# Patient Record
Sex: Male | Born: 1970 | Race: Black or African American | Hispanic: No | Marital: Single | Smoking: Current every day smoker
Health system: Southern US, Community
[De-identification: ages and names within clinical notes are randomized; demographics above are authoritative.]

## PROBLEM LIST (undated history)

## (undated) HISTORY — PX: VASECTOMY: SHX75

---

## 2010-08-01 ENCOUNTER — Emergency Department: Payer: Self-pay | Admitting: Emergency Medicine

## 2010-10-12 DEATH — deceased

## 2011-01-24 ENCOUNTER — Emergency Department: Payer: Self-pay | Admitting: Emergency Medicine

## 2011-06-23 ENCOUNTER — Emergency Department: Payer: Self-pay | Admitting: Emergency Medicine

## 2014-11-23 ENCOUNTER — Emergency Department: Payer: Self-pay | Admitting: Emergency Medicine

## 2018-07-01 ENCOUNTER — Encounter: Payer: Self-pay | Admitting: Emergency Medicine

## 2018-07-01 ENCOUNTER — Emergency Department
Admission: EM | Admit: 2018-07-01 | Discharge: 2018-07-01 | Disposition: A | Discharge status: Home / Self Care | Payer: Self-pay | Attending: Emergency Medicine | Admitting: Emergency Medicine

## 2018-07-01 ENCOUNTER — Other Ambulatory Visit: Payer: Self-pay

## 2018-07-01 DIAGNOSIS — R1084 Generalized abdominal pain: Secondary | ICD-10-CM | POA: Insufficient documentation

## 2018-07-01 DIAGNOSIS — F17210 Nicotine dependence, cigarettes, uncomplicated: Secondary | ICD-10-CM | POA: Insufficient documentation

## 2018-07-01 LAB — CBC WITH DIFFERENTIAL/PLATELET
BASOS ABS: 0.1 10*3/uL (ref 0–0.1)
Basophils Relative: 1 %
EOS PCT: 3 %
Eosinophils Absolute: 0.2 10*3/uL (ref 0–0.7)
HCT: 41.9 % (ref 40.0–52.0)
Hemoglobin: 14.4 g/dL (ref 13.0–18.0)
Lymphocytes Relative: 31 %
Lymphs Abs: 2.2 10*3/uL (ref 1.0–3.6)
MCH: 32.3 pg (ref 26.0–34.0)
MCHC: 34.5 g/dL (ref 32.0–36.0)
MCV: 93.5 fL (ref 80.0–100.0)
MONO ABS: 0.7 10*3/uL (ref 0.2–1.0)
MONOS PCT: 9 %
Neutro Abs: 3.9 10*3/uL (ref 1.4–6.5)
Neutrophils Relative %: 56 %
PLATELETS: 325 10*3/uL (ref 150–440)
RBC: 4.48 MIL/uL (ref 4.40–5.90)
RDW: 12.9 % (ref 11.5–14.5)
WBC: 7.1 10*3/uL (ref 3.8–10.6)

## 2018-07-01 LAB — URINALYSIS, COMPLETE (UACMP) WITH MICROSCOPIC
Bacteria, UA: NONE SEEN
Bilirubin Urine: NEGATIVE
Glucose, UA: NEGATIVE mg/dL
Hgb urine dipstick: NEGATIVE
KETONES UR: 5 mg/dL — AB
Leukocytes, UA: NEGATIVE
Nitrite: NEGATIVE
PH: 5 (ref 5.0–8.0)
Protein, ur: NEGATIVE mg/dL
Specific Gravity, Urine: 1.021 (ref 1.005–1.030)
Squamous Epithelial / LPF: NONE SEEN (ref 0–5)

## 2018-07-01 LAB — COMPREHENSIVE METABOLIC PANEL
ALT: 12 U/L (ref 0–44)
AST: 21 U/L (ref 15–41)
Albumin: 4.1 g/dL (ref 3.5–5.0)
Alkaline Phosphatase: 74 U/L (ref 38–126)
Anion gap: 8 (ref 5–15)
BUN: 11 mg/dL (ref 6–20)
CHLORIDE: 105 mmol/L (ref 98–111)
CO2: 28 mmol/L (ref 22–32)
Calcium: 9.2 mg/dL (ref 8.9–10.3)
Creatinine, Ser: 0.98 mg/dL (ref 0.61–1.24)
GFR calc Af Amer: >60 mL/min (ref >60)
Glucose, Bld: 68 mg/dL — ABNORMAL LOW (ref 70–99)
Potassium: 3.7 mmol/L (ref 3.5–5.1)
Sodium: 141 mmol/L (ref 135–145)
Total Bilirubin: 0.4 mg/dL (ref 0.3–1.2)
Total Protein: 7.8 g/dL (ref 6.5–8.1)

## 2018-07-01 LAB — LIPASE, BLOOD: LIPASE: 50 U/L (ref 11–51)

## 2018-07-01 MED ORDER — MORPHINE SULFATE (PF) 4 MG/ML IV SOLN
INTRAVENOUS | Status: AC
  Administered 2018-07-01: 4 mg via INTRAVENOUS
  Filled 2018-07-01: qty 1

## 2018-07-01 MED ORDER — DICYCLOMINE HCL 20 MG PO TABS: 20.0000 mg | ORAL_TABLET | Freq: Three times a day (TID) | ORAL | 0 refills | Status: DC | PRN

## 2018-07-01 MED ORDER — ONDANSETRON HCL 4 MG/2ML IJ SOLN
4.0000 mg | Freq: Once | INTRAMUSCULAR | Status: AC
  Administered 2018-07-01: 4 mg via INTRAVENOUS

## 2018-07-01 MED ORDER — MORPHINE SULFATE (PF) 4 MG/ML IV SOLN
4.0000 mg | Freq: Once | INTRAVENOUS | Status: AC
  Administered 2018-07-01: 4 mg via INTRAVENOUS

## 2018-07-01 MED ORDER — DICYCLOMINE HCL 10 MG/ML IM SOLN
20.0000 mg | Freq: Once | INTRAMUSCULAR | Status: AC
  Administered 2018-07-01: 20 mg via INTRAMUSCULAR
  Filled 2018-07-01: qty 2

## 2018-07-01 MED ORDER — ONDANSETRON HCL 4 MG/2ML IJ SOLN
INTRAMUSCULAR | Status: AC
  Administered 2018-07-01: 4 mg via INTRAVENOUS
  Filled 2018-07-01: qty 2

## 2018-07-01 MED ORDER — ONDANSETRON HCL 4 MG PO TABS: 4.0000 mg | ORAL_TABLET | Freq: Three times a day (TID) | ORAL | 0 refills | Status: DC | PRN

## 2018-07-01 NOTE — ED Notes (Signed)

## 2018-07-01 NOTE — ED Triage Notes (Signed)
Patient ambulatory to triage with steady gait, without difficulty, appears uncomfortable; st since 4pm yesterday having lower abd pain with no accomp symptoms; denies hx of same

## 2018-07-01 NOTE — ED Provider Notes (Signed)
[facility] Emergency Department Provider Note   ____________________________________________   I have reviewed the triage vital signs and the nursing notes.   HISTORY  Chief Complaint Abdominal Pain   History limited by: Not Limited   HPI Sean Blackburn is a 47 y.o. male who presents to the emergency department today because of concern for abdominal pain. Pain started yesterday around 1 pm. Has been constant since then. Located across the mid and lower abdomen. He describes it as severe. Has been accompanied by some nausea without vomiting. Denies any diarrhea. States had similar pain once before when he thought he had food poisoning. Denies change in urination.  Per medical record review patient has a history of ct abd in the past.  History reviewed. No pertinent past medical history.  There are no active problems to display for this patient.   History reviewed. No pertinent surgical history.  Prior to Admission medications   Not on File    Allergies Hydrocodone  No family history on file.  Social History Social History   Tobacco Use  . Smoking status: Current Every Day Smoker  . Smokeless tobacco: Never Used  Substance Use Topics  . Alcohol use: Not Currently  . Drug use: Not on file    Review of Systems Constitutional: No fever/chills Eyes: No visual changes. ENT: No sore throat. Cardiovascular: Denies chest pain. Respiratory: Denies shortness of breath. Gastrointestinal: Positive for abdominal pain. Genitourinary: Negative for dysuria. Musculoskeletal: Negative for back pain. Skin: Negative for rash. Neurological: Negative for headaches, focal weakness or numbness.  ____________________________________________   PHYSICAL EXAM:  VITAL SIGNS: ED Triage Vitals  Enc Vitals Group     BP 07/01/18 0703 123/72     Pulse Rate 07/01/18 0703 (!) 55     Resp 07/01/18 0703 18     Temp 07/01/18 0703 97.8 F (36.6 C)     Temp  Source 07/01/18 0703 Oral     SpO2 07/01/18 0703 100 %     Weight 07/01/18 0700 145 lb (65.8 kg)     Height 07/01/18 0700 5\' 11"  (1.803 m)     Head Circumference --      Peak Flow --      Pain Score 07/01/18 0700 10   Constitutional: Alert and oriented.  Eyes: Conjunctivae are normal.  ENT      Head: Normocephalic and atraumatic.      Nose: No congestion/rhinnorhea.      Mouth/Throat: Mucous membranes are moist.      Neck: No stridor. Hematological/Lymphatic/Immunilogical: No cervical lymphadenopathy. Cardiovascular: Normal rate, regular rhythm.  No murmurs, rubs, or gallops.  Respiratory: Normal respiratory effort without tachypnea nor retractions. Breath sounds are clear and equal bilaterally. No wheezes/rales/rhonchi. Gastrointestinal: Soft and non tender. No rebound. No guarding.  Genitourinary: Deferred Musculoskeletal: Normal range of motion in all extremities. No lower extremity edema. Neurologic:  Normal speech and language. No gross focal neurologic deficits are appreciated.  Skin:  Skin is warm, dry and intact. No rash noted. Psychiatric: Mood and affect are normal. Speech and behavior are normal. Patient exhibits appropriate insight and judgment.  ____________________________________________    LABS (pertinent positives/negatives)  Lipase 50 CMP wnl except glu 68 UA ketones 5 otherwise unremarkable CBC wbc 7.1, hgb 14.4, plt 325  ____________________________________________   EKG  None  ____________________________________________    RADIOLOGY  None  ____________________________________________   PROCEDURES  Procedures  ____________________________________________   INITIAL IMPRESSION / ASSESSMENT AND PLAN / ED COURSE  Pertinent  labs & imaging results that were available during my care of the patient were reviewed by me and considered in my medical decision making (see chart for details).   Presented to the emergency department today because  of concerns for abdominal pain.  Differential would be large including urinary tract infection, kidney stone, diverticulitis, appendicitis, gallbladder disease, or enteritis, IBS, gastritis amongst other etiologies.  Patient's work-up without any concerning leukocytosis.  Urine without findings consistent for infection or kidney stone.  Patient did feel better after medications.  At this point I think gastroenteritis likely.  Discussed this with the patient.  Discussed return precautions.   ____________________________________________   FINAL CLINICAL IMPRESSION(S) / ED DIAGNOSES  Final diagnoses:  Generalized abdominal pain     Note: This dictation was prepared with Dragon dictation. Any transcriptional errors that result from this process are unintentional     Phineas Semen, MD 07/01/18 1248

## 2018-07-01 NOTE — Discharge Instructions (Addendum)
Please seek medical attention for any high fevers, chest pain, shortness of breath, change in behavior, persistent vomiting, bloody stool or any other new or concerning symptoms.  

## 2019-04-12 ENCOUNTER — Ambulatory Visit (INDEPENDENT_AMBULATORY_CARE_PROVIDER_SITE_OTHER): Payer: Self-pay

## 2019-04-12 ENCOUNTER — Other Ambulatory Visit: Payer: Self-pay

## 2019-04-12 ENCOUNTER — Ambulatory Visit
Admission: EM | Admit: 2019-04-12 | Discharge: 2019-04-12 | Disposition: A | Discharge status: Home / Self Care | Payer: Self-pay | Attending: Emergency Medicine | Admitting: Emergency Medicine

## 2019-04-12 ENCOUNTER — Encounter: Payer: Self-pay | Admitting: Emergency Medicine

## 2019-04-12 DIAGNOSIS — M25562 Pain in left knee: Secondary | ICD-10-CM

## 2019-04-12 DIAGNOSIS — M545 Low back pain, unspecified: Secondary | ICD-10-CM

## 2019-04-12 DIAGNOSIS — M25561 Pain in right knee: Secondary | ICD-10-CM

## 2019-04-12 DIAGNOSIS — M542 Cervicalgia: Secondary | ICD-10-CM

## 2019-04-12 MED ORDER — CYCLOBENZAPRINE HCL 10 MG PO TABS: 10.0000 mg | ORAL_TABLET | Freq: Two times a day (BID) | ORAL | 0 refills | Status: DC | PRN

## 2019-04-12 MED ORDER — PREDNISONE 10 MG PO TABS: ORAL_TABLET | ORAL | 0 refills | Status: DC

## 2019-04-12 NOTE — Discharge Instructions (Signed)
Take medication as prescribed. Rest. Drink plenty of fluids. Stretch. Avoid strenuous activity.  Follow up with your primary care physician or the above this week as needed. Return to Urgent care for new or worsening concerns.
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
## 2019-08-27 ENCOUNTER — Emergency Department: Payer: Self-pay

## 2019-08-27 ENCOUNTER — Other Ambulatory Visit: Payer: Self-pay

## 2019-08-27 ENCOUNTER — Emergency Department
Admission: EM | Admit: 2019-08-27 | Discharge: 2019-08-27 | Disposition: A | Discharge status: Home / Self Care | Payer: Self-pay | Attending: Emergency Medicine | Admitting: Emergency Medicine

## 2019-08-27 DIAGNOSIS — F17291 Nicotine dependence, other tobacco product, in remission: Secondary | ICD-10-CM | POA: Insufficient documentation

## 2019-08-27 DIAGNOSIS — N50819 Testicular pain, unspecified: Secondary | ICD-10-CM

## 2019-08-27 DIAGNOSIS — N452 Orchitis: Secondary | ICD-10-CM

## 2019-08-27 LAB — URINALYSIS, COMPLETE (UACMP) WITH MICROSCOPIC
Bacteria, UA: NONE SEEN
Bilirubin Urine: NEGATIVE
Glucose, UA: NEGATIVE mg/dL
Hgb urine dipstick: NEGATIVE
Ketones, ur: NEGATIVE mg/dL
Leukocytes,Ua: NEGATIVE
Nitrite: NEGATIVE
Protein, ur: NEGATIVE mg/dL
Specific Gravity, Urine: 1.014 (ref 1.005–1.030)
Squamous Epithelial / LPF: NONE SEEN (ref 0–5)
pH: 7 (ref 5.0–8.0)

## 2019-08-27 MED ORDER — TRAMADOL HCL 50 MG PO TABS: 50.0000 mg | ORAL_TABLET | Freq: Four times a day (QID) | ORAL | 0 refills | Status: DC | PRN

## 2019-08-27 MED ORDER — TRAMADOL HCL 50 MG PO TABS: 50.0000 mg | ORAL_TABLET | Freq: Four times a day (QID) | ORAL | 0 refills | Status: AC | PRN

## 2019-08-27 MED ORDER — SULFAMETHOXAZOLE-TRIMETHOPRIM 800-160 MG PO TABS: 1.0000 | ORAL_TABLET | Freq: Two times a day (BID) | ORAL | 0 refills | Status: AC

## 2019-08-27 MED ORDER — ONDANSETRON 4 MG PO TBDP
4.0000 mg | ORAL_TABLET | Freq: Once | ORAL | Status: AC
  Administered 2019-08-27: 10:00:00 4 mg via ORAL
  Filled 2019-08-27: qty 1

## 2019-08-27 MED ORDER — SULFAMETHOXAZOLE-TRIMETHOPRIM 800-160 MG PO TABS: 1.0000 | ORAL_TABLET | Freq: Two times a day (BID) | ORAL | 0 refills | Status: DC

## 2019-08-27 MED ORDER — MORPHINE SULFATE (PF) 4 MG/ML IV SOLN
4.0000 mg | Freq: Once | INTRAVENOUS | Status: AC
  Administered 2019-08-27: 4 mg via INTRAMUSCULAR
  Filled 2019-08-27: qty 1

## 2019-08-27 NOTE — Discharge Instructions (Addendum)
Follow-up with your regular doctor or urology if not better in 2 to 3 days.  Please call for an appointment.  Take your antibiotic as prescribed.  Return emergency department if you are worsening

## 2019-08-27 NOTE — ED Triage Notes (Signed)
Pt states testicle pain since Friday. States noticed swelling this AM. Denies injury. States dysuria but denies discharge. A&O.

## 2019-08-27 NOTE — ED Notes (Signed)
See triage note  Presents with pain to lower back and pain into his testicles  States pain started on Friday  Denies any trauma ,blood in urine or discharge

## 2019-08-27 NOTE — ED Provider Notes (Signed)
[facility] Emergency Department Provider Note  ____________________________________________   First MD Initiated Contact with Patient 08/27/19 1001     (approximate)  I have reviewed the triage vital signs and the nursing notes.   HISTORY  Chief Complaint Testicle Pain    HPI Sean Blackburn is a 48 y.o. male presents emergency department complaining of testicular pain since Friday.  States pain started late Thursday night into Friday.  States it is difficult to walk.  No penile discharge.  No burning with urination.  No history of a hernia.  States he is unsure if the testicles are twisting.  He did have a vasectomy about 15 years ago.  History of Lyme's disease also.  He denies any fever or chills.  No trauma to the area.    History reviewed. No pertinent past medical history.  There are no active problems to display for this patient.   Past Surgical History:  Procedure Laterality Date  . VASECTOMY      Prior to Admission medications   Medication Sig Start Date End Date Taking? Authorizing Provider  sulfamethoxazole-trimethoprim (BACTRIM DS) 800-160 MG tablet Take 1 tablet by mouth 2 (two) times daily. 08/27/19   Fisher, Linden Dolin, PA-C  traMADol (ULTRAM) 50 MG tablet Take 1 tablet (50 mg total) by mouth every 6 (six) hours as needed. 08/27/19   Fisher, Linden Dolin, PA-C  dicyclomine (BENTYL) 20 MG tablet Take 1 tablet (20 mg total) by mouth 3 (three) times daily as needed (abdominal pain). 07/01/18 04/12/19  Nance Pear, MD    Allergies Hydrocodone  Family History  Family history unknown: Yes    Social History Social History   Tobacco Use  . Smoking status: Current Every Day Smoker    Types: Cigars  . Smokeless tobacco: Never Used  Substance Use Topics  . Alcohol use: Not Currently  . Drug use: Never    Review of Systems  Constitutional: No fever/chills Eyes: No visual changes. ENT: No sore throat. Respiratory: Denies cough  Genitourinary: Negative for dysuria.  Positive for testicular pain Musculoskeletal: Negative for back pain. Skin: Negative for rash.    ____________________________________________   PHYSICAL EXAM:  VITAL SIGNS: ED Triage Vitals  Enc Vitals Group     BP 08/27/19 0942 116/73     Pulse Rate 08/27/19 0942 66     Resp 08/27/19 0942 18     Temp 08/27/19 0942 98.6 F (37 C)     Temp Source 08/27/19 0942 Oral     SpO2 08/27/19 0942 98 %     Weight 08/27/19 0943 147 lb (66.7 kg)     Height 08/27/19 0943 6\' 2"  (1.88 m)     Head Circumference --      Peak Flow --      Pain Score 08/27/19 0943 7     Pain Loc --      Pain Edu? --      Excl. in [place]? --     Constitutional: Alert and oriented. Well appearing and in no acute distress. Eyes: Conjunctivae are normal.  Head: Atraumatic. Nose: No congestion/rhinnorhea. Mouth/Throat: Mucous membranes are moist.   Neck:  supple no lymphadenopathy noted Cardiovascular: Normal rate, regular rhythm.  Respiratory: Normal respiratory effort.  No retractions,  GU: Testicles are tender, questionable epididymitis, no hernia noted Musculoskeletal: FROM all extremities, warm and well perfused, patient is walking with legs widened to avoid pressing on the testicles Neurologic:  Normal speech and language.  Skin:  Skin  is warm, dry and intact. No rash noted. Psychiatric: Mood and affect are normal. Speech and behavior are normal.  ____________________________________________   LABS (all labs ordered are listed, but only abnormal results are displayed)  Labs Reviewed  URINALYSIS, COMPLETE (UACMP) WITH MICROSCOPIC - Abnormal; Notable for the following components:      Result Value   Color, Urine YELLOW (*)    APPearance CLEAR (*)    All other components within normal limits   ____________________________________________   ____________________________________________  RADIOLOGY  Ultrasound scrotum   ____________________________________________   PROCEDURES  Procedure(s) performed: Morphine 4 mg IM, Zofran 4 mg ODT   Procedures    ____________________________________________   INITIAL IMPRESSION / ASSESSMENT AND PLAN / ED COURSE  Pertinent labs & imaging results that were available during my care of the patient were reviewed by me and considered in my medical decision making (see chart for details).   Patient's 48 year old male presents emergency department with complaints of testicular pain.  See HPI  Physical exam shows patient to appear well.  Testicles are very tender to palpation.  No hernia noted.  UA ordered Morphine 4 mg IM, Zofran 4 mg ODT Ultrasound of the scrotum   Ultrasound of the scrotum is negative for any acute abnormality.  Discussed the case with Dr. Lenard Lance, will treat him for orchitis.  Patient was given a prescription for Septra DS 1 twice daily for 10 days.  Per up-to-date he could use either Levaquin or Septra.  Patient is unsure if his insurance status so I gave him the least expensive medication.  He is to follow-up with urology.  Return emergency department worsening.  States he understands will comply.  Is discharged stable condition.   Sean Blackburn was evaluated in Emergency Department on 08/27/2019 for the symptoms described in the history of present illness. He was evaluated in the context of the global COVID-19 pandemic, which necessitated consideration that the patient might be at risk for infection with the SARS-CoV-2 virus that causes COVID-19. Institutional protocols and algorithms that pertain to the evaluation of patients at risk for COVID-19 are in a state of rapid change based on information released by regulatory bodies including the CDC and federal and state organizations. These policies and algorithms were followed during the patient's care in the ED.   As part of my medical decision making, I reviewed the following data within  the electronic MEDICAL RECORD NUMBER Nursing notes reviewed and incorporated, Labs reviewed UA is normal, Old chart reviewed, Radiograph reviewed ultrasounds negative, Notes from prior ED visits and  Controlled Substance Database  ____________________________________________   FINAL CLINICAL IMPRESSION(S) / ED DIAGNOSES  Final diagnoses:  Orchitis of right testicle      NEW MEDICATIONS STARTED DURING THIS VISIT:  Discharge Medication List as of 08/27/2019 11:56 AM       Note:  This document was prepared using Dragon voice recognition software and may include unintentional dictation errors.    Faythe Ghee, PA-C 08/27/19 1553    Minna Antis, MD 08/28/19 2025

## 2021-04-13 IMAGING — US US SCROTUM W/ DOPPLER COMPLETE
1 series · 13 of 25 positions shown · non-contrast
Comparison: None.

CLINICAL DATA: Low back and bilateral testicular pain the past 2
days.

EXAM:
SCROTAL ULTRASOUND
DOPPLER ULTRASOUND OF THE TESTICLES
TECHNIQUE: Complete ultrasound examination of the testicles, epididymis, and
other scrotal structures was performed. Color and spectral Doppler
ultrasound were also utilized to evaluate blood flow to the
testicles.

[Series 1: us scrotum w/ doppler complete · 48 acquisitions, 13 frames shown]
[im 1/48]
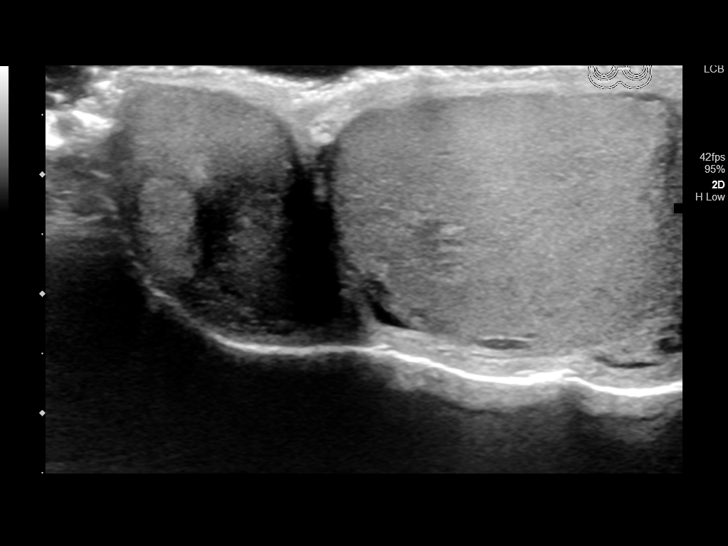
[im 4/48]
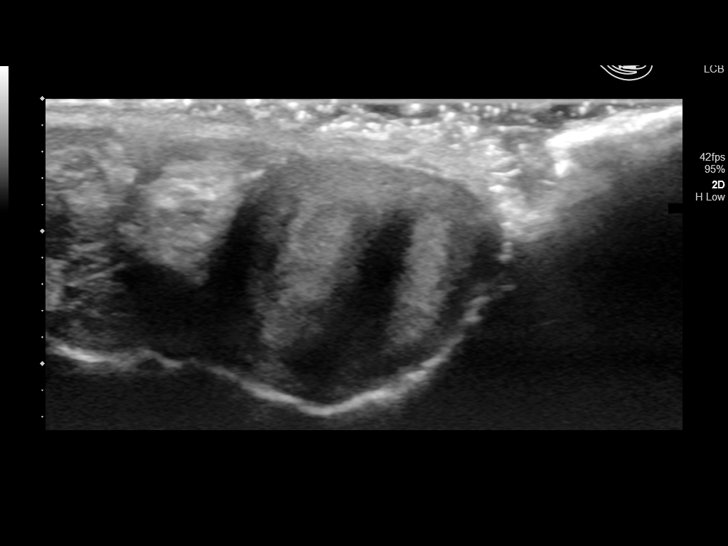
[im 8/48]
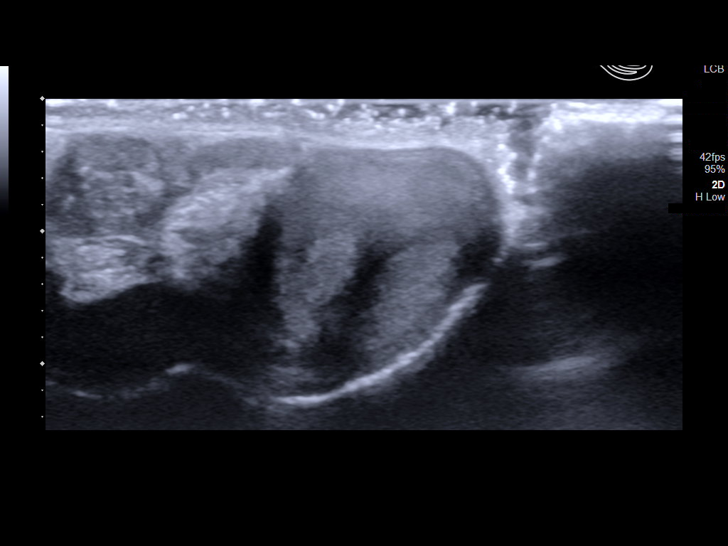
[im 12/48]
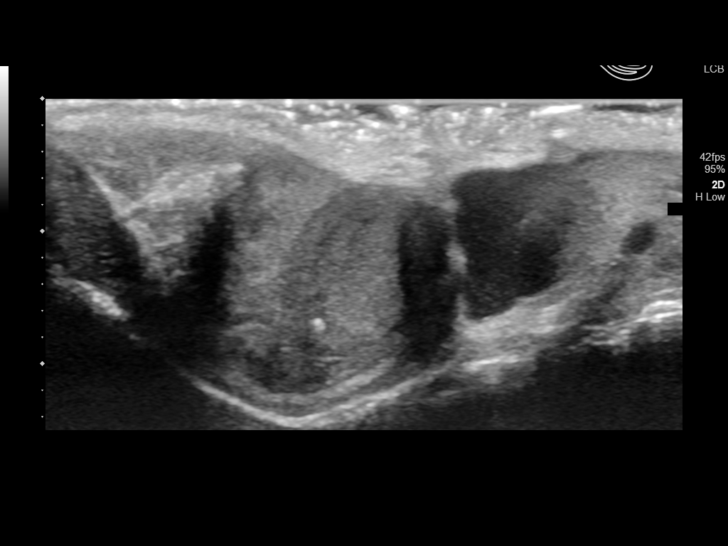
[im 16/48]
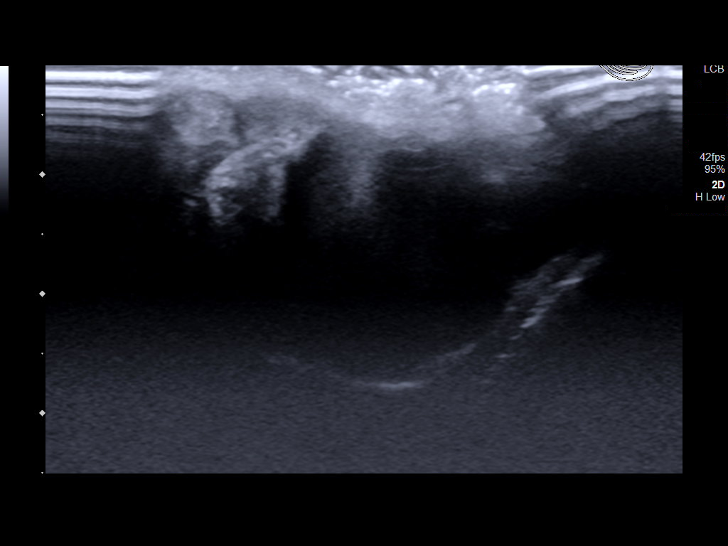
[im 20/48]
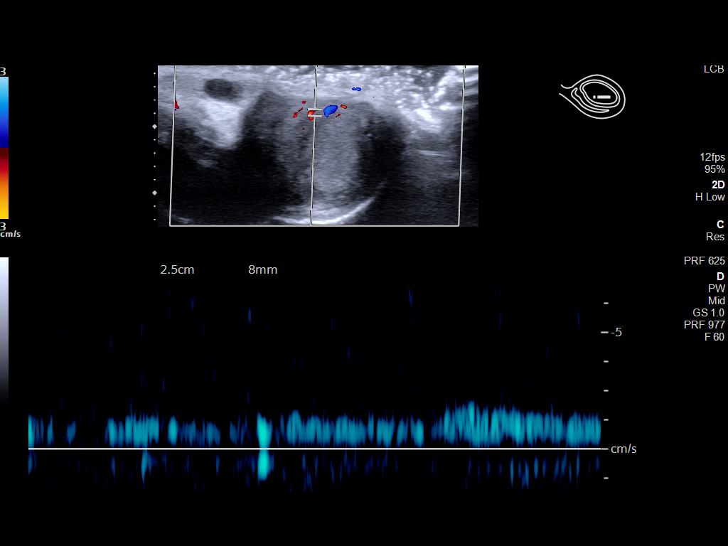
[im 24/48]
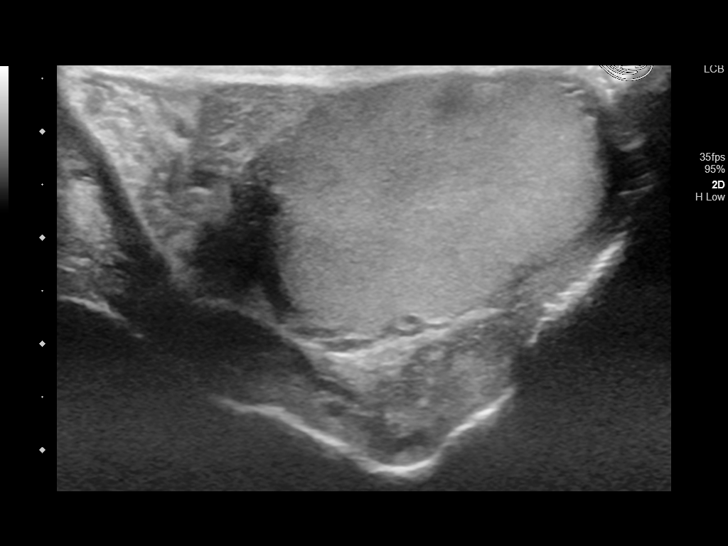
[im 28/48]
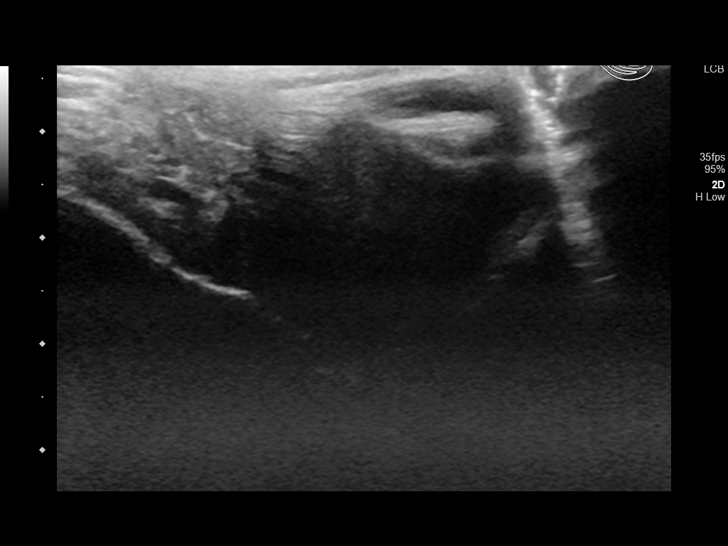
[im 32/48]
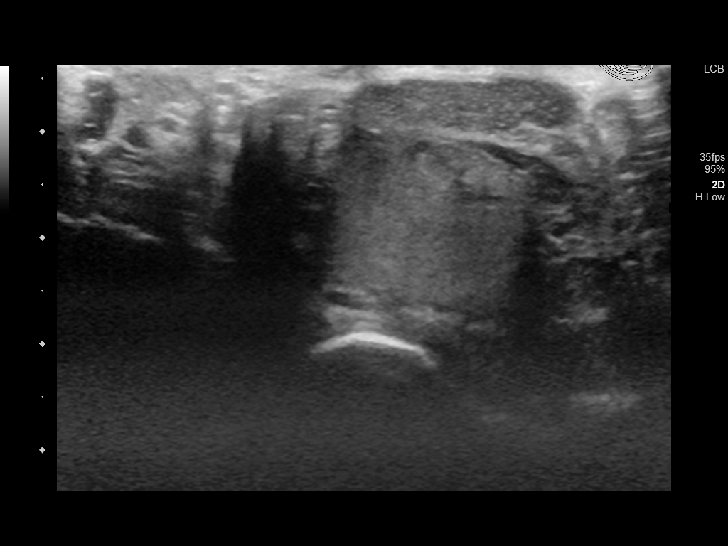
[im 36/48]
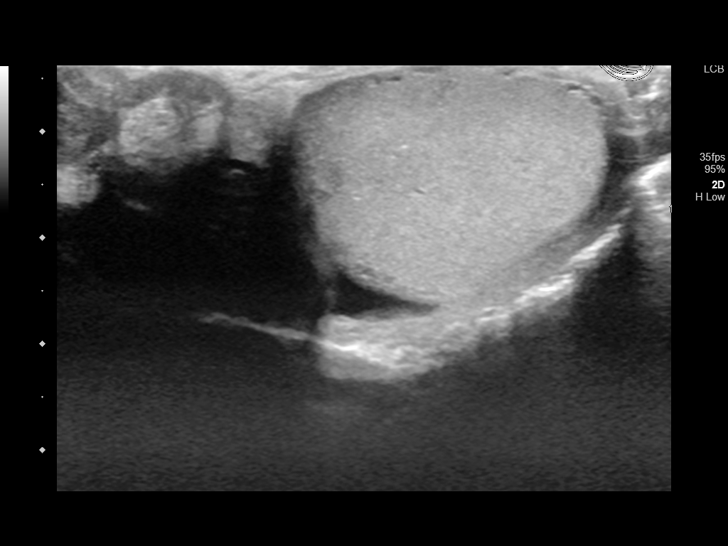
[im 40/48]
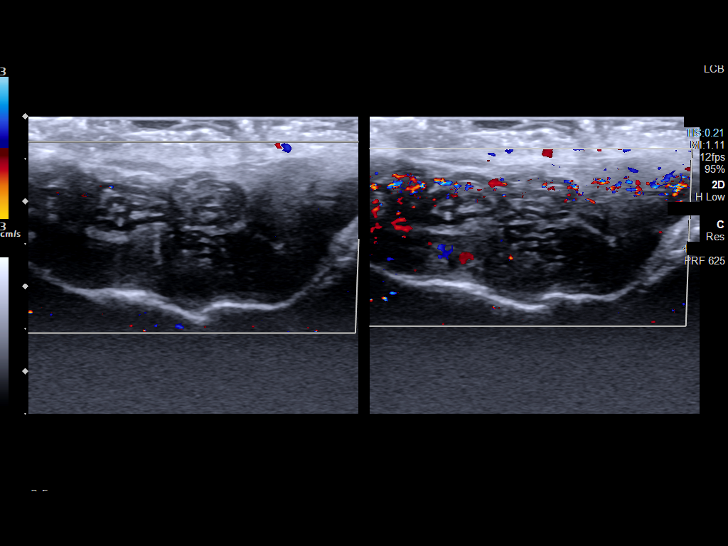
[im 44/48]
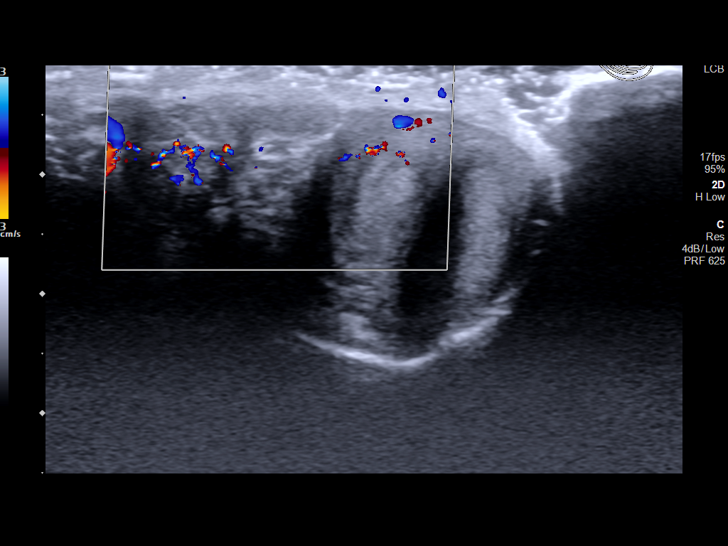
[im 48/48]
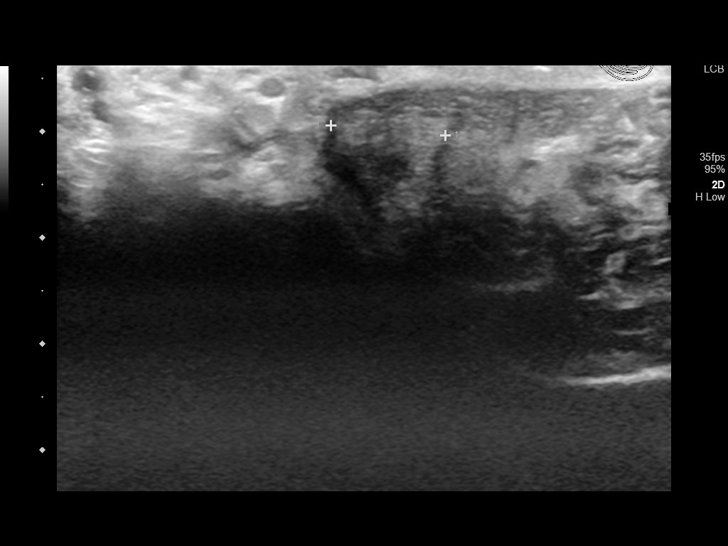

[13 of 25 positions shown; findings below may reference images not displayed]

FINDINGS: Right testicle

Measurements: 2.2 x 1.8 x 1.5 cm. Diffusely heterogeneous with a
somewhat stellate hypoechoic area centrally, suggesting a central
scar.

Left testicle

Measurements: 4.0 x 2.9 x 2.5 cm. No mass or microlithiasis
visualized.

Right epididymis:  Normal in size and appearance.

Left epididymis:  Normal in size and appearance.

Hydrocele:  Small left hydrocele

Varicocele:  Possible left varicocele.

Pulsed Doppler interrogation of both testes demonstrates normal low
resistance arterial and venous waveforms bilaterally.
IMPRESSION: 1. Small and significantly heterogeneous right testicle, possibly
due to previous trauma. This does not have the appearance of a mass.
2. Normal appearing left testicle and bilateral epididymi.
3. Small left hydrocele.
4. Possible left varicocele.
